# Patient Record
Sex: Female | Born: 1980 | Hispanic: No | Marital: Single | Smoking: Never smoker
Health system: Southern US, Community
[De-identification: ages and names within clinical notes are randomized; demographics above are authoritative.]

## PROBLEM LIST (undated history)

## (undated) DIAGNOSIS — G43909 Migraine, unspecified, not intractable, without status migrainosus: Secondary | ICD-10-CM

## (undated) DIAGNOSIS — F419 Anxiety disorder, unspecified: Secondary | ICD-10-CM

## (undated) DIAGNOSIS — T7840XA Allergy, unspecified, initial encounter: Secondary | ICD-10-CM

## (undated) HISTORY — DX: Allergy, unspecified, initial encounter: T78.40XA

## (undated) HISTORY — PX: NASAL SEPTUM SURGERY: SHX37

## (undated) HISTORY — DX: Anxiety disorder, unspecified: F41.9

---
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
## 2008-09-19 ENCOUNTER — Encounter: Admission: RE | Admit: 2008-09-19 | Discharge: 2008-09-19 | Payer: Self-pay | Admitting: Family Medicine

## 2008-09-19 ENCOUNTER — Ambulatory Visit: Payer: Self-pay | Admitting: Family Medicine

## 2009-10-28 IMAGING — CR DG SHOULDER 2+V*L*
2 series · 2 of 2 positions shown · non-contrast
Comparison: None

CLINICAL DATA: Injured snow boarding with pain

LEFT SHOULDER - 2+ VIEW

[view not recorded (1 of 2)]
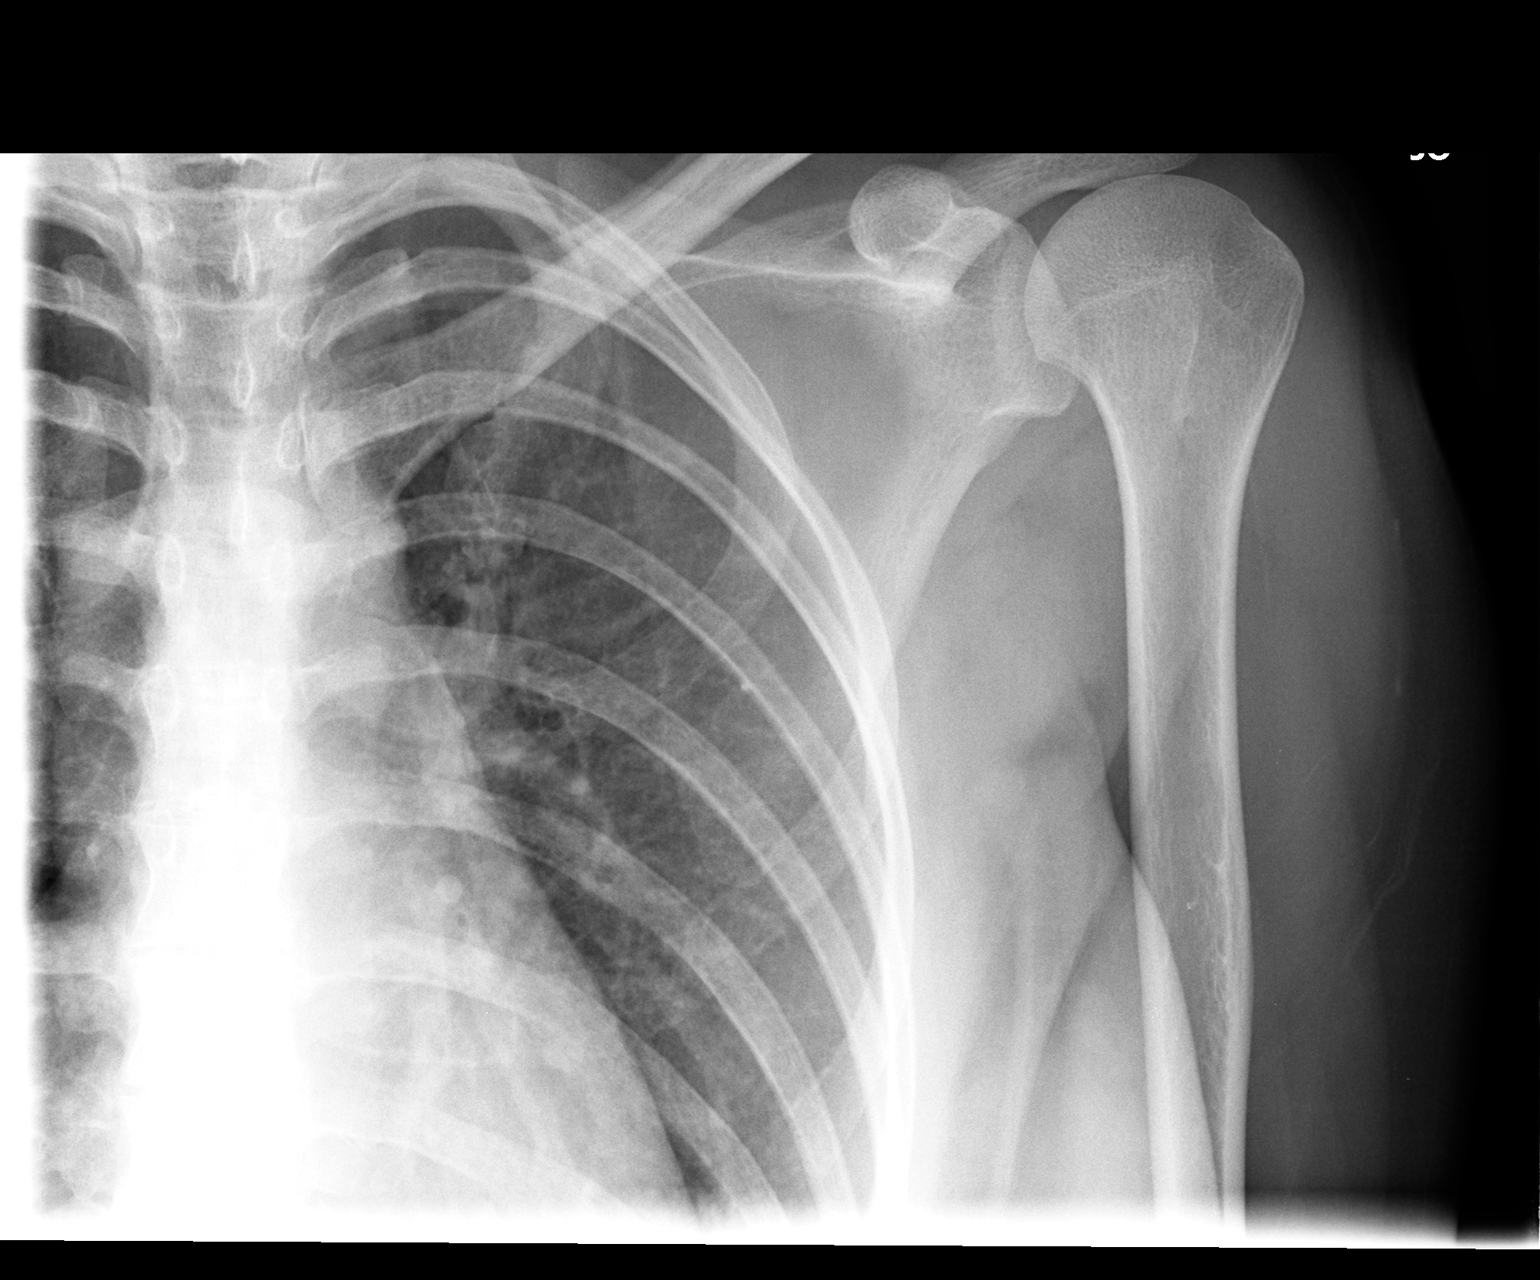

[view not recorded (2 of 2)]
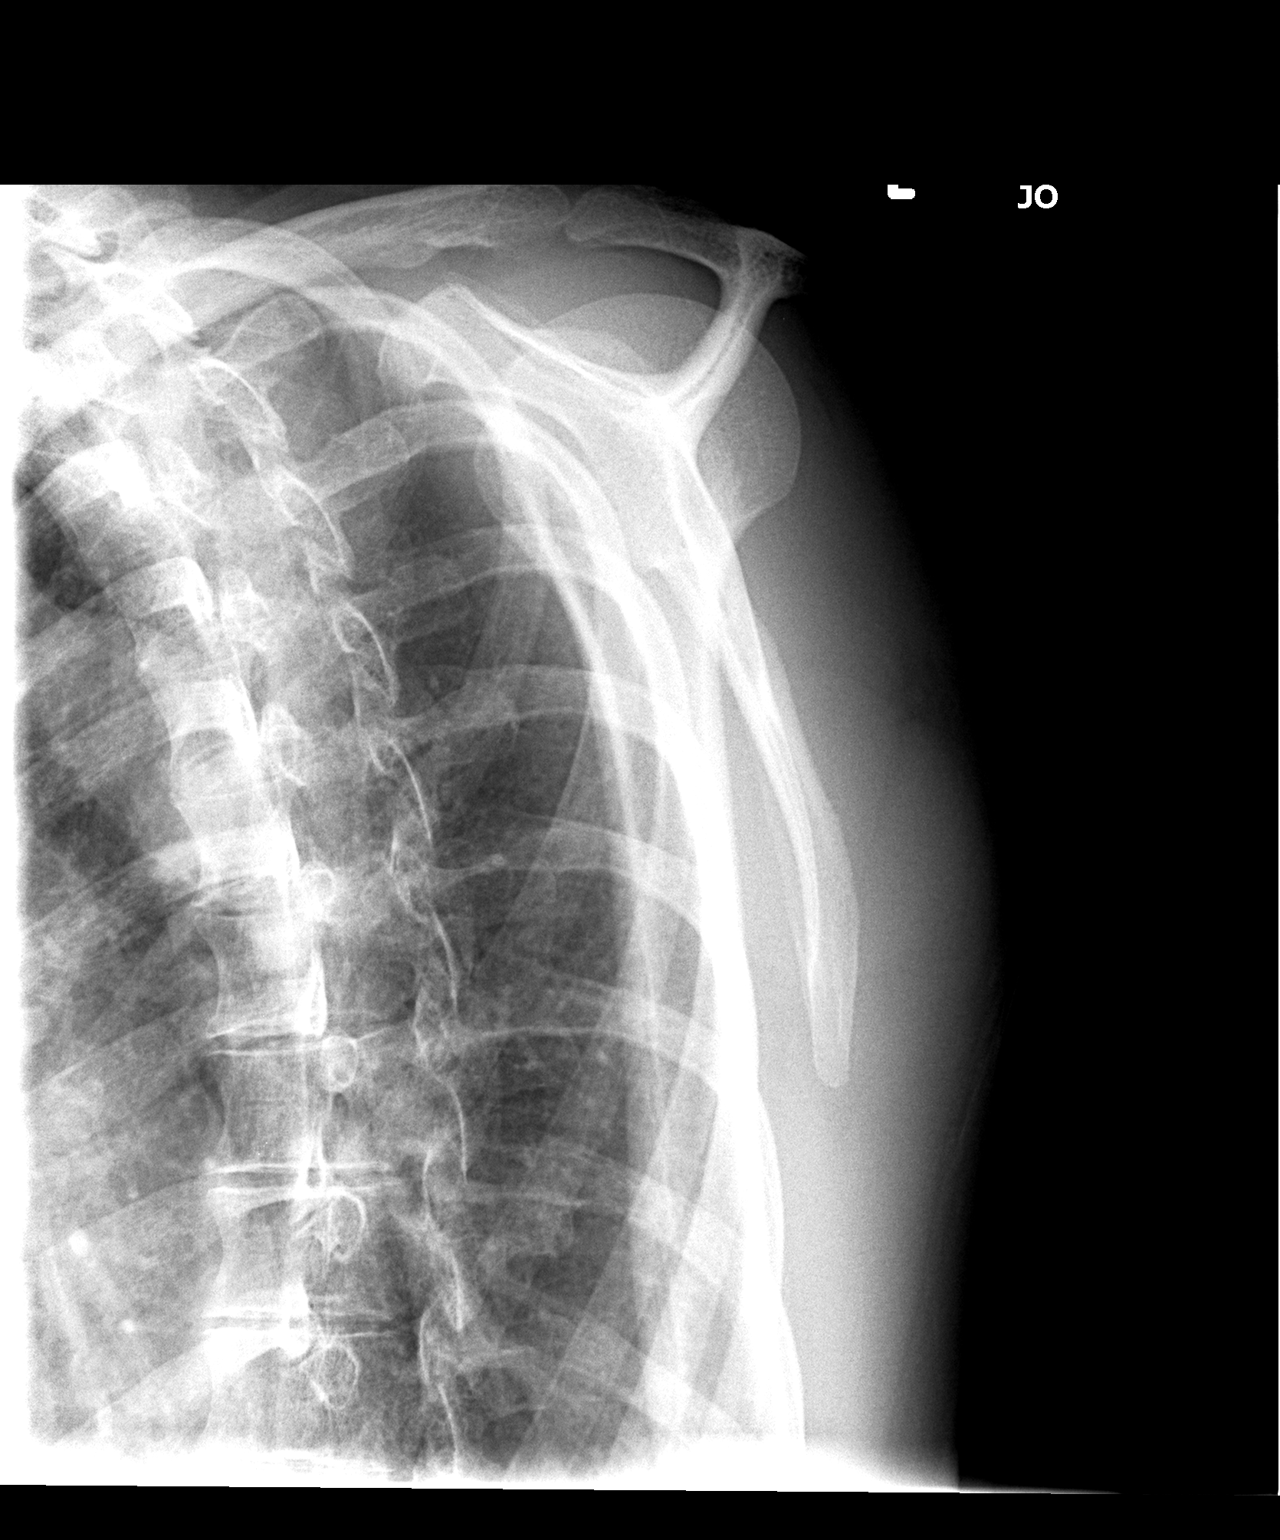

[2 of 2 positions shown; findings below may reference images not displayed]

FINDINGS: The glenohumeral joint space appears normal.  On the Y
view there is no evidence of dislocation, and no fracture is seen.
The AC joint is normally aligned.
IMPRESSION: No acute bony abnormality.

## 2012-10-15 ENCOUNTER — Ambulatory Visit: Payer: Self-pay | Admitting: Physician Assistant

## 2012-10-15 VITALS — BP 94/60 | HR 77 | Temp 97.9°F | Resp 16 | Ht 63.5 in | Wt 121.0 lb

## 2012-10-15 DIAGNOSIS — R3915 Urgency of urination: Secondary | ICD-10-CM

## 2012-10-15 DIAGNOSIS — N898 Other specified noninflammatory disorders of vagina: Secondary | ICD-10-CM

## 2012-10-15 DIAGNOSIS — R8290 Unspecified abnormal findings in urine: Secondary | ICD-10-CM

## 2012-10-15 DIAGNOSIS — R82998 Other abnormal findings in urine: Secondary | ICD-10-CM

## 2012-10-15 LAB — POCT URINALYSIS DIPSTICK
Bilirubin, UA: NEGATIVE
Glucose, UA: NEGATIVE
Leukocytes, UA: NEGATIVE
Nitrite, UA: NEGATIVE
Urobilinogen, UA: 0.2

## 2012-10-15 LAB — POCT UA - MICROSCOPIC ONLY
Crystals, Ur, HPF, POC: NEGATIVE
Mucus, UA: NEGATIVE
Yeast, UA: NEGATIVE

## 2012-10-15 LAB — POCT WET PREP WITH KOH
Clue Cells Wet Prep HPF POC: NEGATIVE
KOH Prep POC: NEGATIVE
Trichomonas, UA: NEGATIVE
Yeast Wet Prep HPF POC: NEGATIVE

## 2012-10-15 NOTE — Progress Notes (Signed)
Subjective:    Patient ID: Allison Pruitt, female    DOB: 09/22/1980, 32 y.o.   MRN: [id number]  HPI 32 year old female presents with 3 weeks of intermittent vague urinary symptoms.  States she has symptoms of urgency in the morning that resolve during the day. This occurred about a week ago but has gone away. She is here today because she is getting ready to go to a conference in [place] and wants to make sure she doesn't have a UTI.  Also complains of "hot dog water" smelling urine.  States she has noticed this several times over the past three weeks. Denies vaginal discharge today, but does admit that 3 weeks ago before her menses she noticed a slight discharge that was similar to BV she has had in the past.  No concern about sexually transmitted infections or pregnancy - she is not currently sexually active. Denies fever, chills, nausea, vomiting, abdominal pain, or flank pain.   Patient otherwise healthy with no other concerns today      Review of Systems  Constitutional: Negative for chills.  Gastrointestinal: Negative for nausea, vomiting and abdominal pain.  Genitourinary: Positive for vaginal discharge. Negative for dysuria, frequency, flank pain, vaginal bleeding and pelvic pain.       Objective:   Physical Exam  Constitutional: She is oriented to person, place, and time. She appears well-developed and well-nourished.  HENT:  Head: Normocephalic and atraumatic.  Right Ear: External ear normal.  Left Ear: External ear normal.  Eyes: Conjunctivae are normal.  Cardiovascular: Normal rate, regular rhythm and normal heart sounds.   Pulmonary/Chest: Effort normal and breath sounds normal.  Abdominal: Soft. Bowel sounds are normal. There is no tenderness. There is no rebound, no guarding and no CVA tenderness.  Neurological: She is alert and oriented to person, place, and time.  Psychiatric: She has a normal mood and affect. Her behavior is normal. Judgment and thought content  normal.      Results for orders placed in visit on 10/15/12  POCT URINALYSIS DIPSTICK      Result Value Range   Color, UA yellow     Clarity, UA clear     Glucose, UA neg     Bilirubin, UA neg     Ketones, UA neg     Spec Grav, UA <=1.005     Blood, UA neg     pH, UA 7.0     Protein, UA neg     Urobilinogen, UA 0.2     Nitrite, UA neg     Leukocytes, UA Negative    POCT UA - MICROSCOPIC ONLY      Result Value Range   WBC, Ur, HPF, POC 0-2     RBC, urine, microscopic 0-1     Bacteria, U Microscopic small     Mucus, UA neg     Epithelial cells, urine per micros 0-2     Crystals, Ur, HPF, POC neg     Casts, Ur, LPF, POC neg     Yeast, UA neg    POCT WET PREP WITH KOH      Result Value Range   Trichomonas, UA Negative     Clue Cells Wet Prep HPF POC neg     Epithelial Wet Prep HPF POC 4-6     Yeast Wet Prep HPF POC neg     Bacteria Wet Prep HPF POC small     RBC Wet Prep HPF POC 0-2  WBC Wet Prep HPF POC 3-6     KOH Prep POC Negative         Assessment & Plan:  Urinary urgency - Plan: POCT urinalysis dipstick, POCT UA - Microscopic Only  Abnormal urine odor - Plan: POCT urinalysis dipstick, POCT UA - Microscopic Only  Leukorrhea, not specified as infective - Plan: POCT Wet Prep with KOH  Reassurance provided. No evidence of infection today.  Follow up if symptoms return.

## 2014-08-04 ENCOUNTER — Encounter (HOSPITAL_BASED_OUTPATIENT_CLINIC_OR_DEPARTMENT_OTHER): Payer: Self-pay | Admitting: Family Medicine

## 2014-08-04 ENCOUNTER — Emergency Department (HOSPITAL_BASED_OUTPATIENT_CLINIC_OR_DEPARTMENT_OTHER)
Admission: EM | Admit: 2014-08-04 | Discharge: 2014-08-04 | Disposition: A | Discharge status: Home / Self Care | Payer: BLUE CROSS/BLUE SHIELD | Attending: Emergency Medicine | Admitting: Emergency Medicine

## 2014-08-04 DIAGNOSIS — Z880 Allergy status to penicillin: Secondary | ICD-10-CM | POA: Insufficient documentation

## 2014-08-04 DIAGNOSIS — F419 Anxiety disorder, unspecified: Secondary | ICD-10-CM | POA: Diagnosis not present

## 2014-08-04 DIAGNOSIS — R1110 Vomiting, unspecified: Secondary | ICD-10-CM | POA: Insufficient documentation

## 2014-08-04 DIAGNOSIS — G43009 Migraine without aura, not intractable, without status migrainosus: Secondary | ICD-10-CM

## 2014-08-04 DIAGNOSIS — G43909 Migraine, unspecified, not intractable, without status migrainosus: Secondary | ICD-10-CM | POA: Diagnosis present

## 2014-08-04 HISTORY — DX: Migraine, unspecified, not intractable, without status migrainosus: G43.909

## 2014-08-04 MED ORDER — METOCLOPRAMIDE HCL 5 MG/ML IJ SOLN
10.0000 mg | Freq: Once | INTRAMUSCULAR | Status: AC
  Administered 2014-08-04: 10 mg via INTRAVENOUS
  Filled 2014-08-04: qty 2

## 2014-08-04 MED ORDER — SODIUM CHLORIDE 0.9 % IV SOLN
Freq: Once | INTRAVENOUS | Status: AC
  Administered 2014-08-04: 20:00:00 via INTRAVENOUS

## 2014-08-04 MED ORDER — DIPHENHYDRAMINE HCL 50 MG/ML IJ SOLN
25.0000 mg | Freq: Once | INTRAMUSCULAR | Status: AC
  Administered 2014-08-04: 25 mg via INTRAVENOUS
  Filled 2014-08-04: qty 1

## 2014-08-04 MED ORDER — KETOROLAC TROMETHAMINE 30 MG/ML IJ SOLN
30.0000 mg | Freq: Once | INTRAMUSCULAR | Status: AC
  Administered 2014-08-04: 30 mg via INTRAVENOUS
  Filled 2014-08-04: qty 1

## 2014-08-04 NOTE — ED Provider Notes (Signed)
CSN: [id number]     Arrival date & time 08/04/14  1902 History   First MD Initiated Contact with Patient 08/04/14 1928     Chief Complaint  Patient presents with  . Migraine     (Consider location/radiation/quality/duration/timing/severity/associated sxs/prior Treatment) Patient is a 34 y.o. female presenting with migraines. The history is provided by the patient. No language interpreter was used.  Migraine This is a new problem. The current episode started today. The problem occurs constantly. The problem has been gradually worsening. Associated symptoms include vomiting. Nothing aggravates the symptoms. She has tried nothing for the symptoms. The treatment provided no relief.  no relief with imitrex.  Past Medical History  Diagnosis Date  . Allergy   . Anxiety   . Migraine    Past Surgical History  Procedure Laterality Date  . Nasal septum surgery     Family History  Problem Relation Age of Onset  . Hypertension Father   . Diabetes Paternal Grandfather    History  Substance Use Topics  . Smoking status: Never Smoker   . Smokeless tobacco: Not on file  . Alcohol Use: No   OB History    No data available     Review of Systems  Gastrointestinal: Positive for vomiting.  All other systems reviewed and are negative.     Allergies  Hydrocodone and Penicillins  Home Medications   Prior to Admission medications   Medication Sig Start Date End Date Taking? Authorizing Provider  SUMAtriptan Succinate (IMITREX PO) Take by mouth.   Yes Historical Provider, MD  ALPRAZolam (XANAX) 0.25 MG tablet Take 0.25 mg by mouth as needed for sleep.    Historical Provider, MD   BP 104/60 mmHg  Pulse 78  Temp(Src) 97.7 F (36.5 C) (Oral)  Resp 18  Ht 5\' 3"  (1.6 m)  Wt 122 lb (55.339 kg)  BMI 21.62 kg/m2  SpO2 98%  LMP 08/03/2014 Physical Exam  Constitutional: She is oriented to person, place, and time. She appears well-developed and well-nourished.  HENT:  Head:  Normocephalic and atraumatic.  Right Ear: External ear normal.  Left Ear: External ear normal.  Nose: Nose normal.  Mouth/Throat: Oropharynx is clear and moist.  Eyes: Pupils are equal, round, and reactive to light.  Neck: Normal range of motion.  Cardiovascular: Normal rate.   Pulmonary/Chest: Effort normal.  Abdominal: Soft.  Musculoskeletal: Normal range of motion.  Neurological: She is alert and oriented to person, place, and time.  Skin: Skin is warm.  Psychiatric: She has a normal mood and affect.  Nursing note and vitals reviewed.   ED Course  Procedures (including critical care time) Labs Review Labs Reviewed - No data to display  Imaging Review No results found.   EKG Interpretation None      MDM  Pt feels much better after IV fluids, torodol, benadryl and reglan.       Final diagnoses:  Migraine without aura and without status migrainosus, not intractable       Elson AreasLeslie K Sofia, PA-C 08/04/14 2150  Gerhard Munchobert Lockwood, MD 08/04/14 [phone]

## 2014-08-04 NOTE — ED Notes (Signed)
Pt c/o migraine since this morning and sts "it is the worst migraine ever". Pt also vomiting and unable to keep down imitrex.

## 2014-08-04 NOTE — Discharge Instructions (Signed)
